# Patient Record
Sex: Male | Born: 1975 | Race: Black or African American | Hispanic: No | Marital: Single | Smoking: Current some day smoker
Health system: Southern US, Community
[De-identification: ages and names within clinical notes are randomized; demographics above are authoritative.]

---

## 2019-04-19 ENCOUNTER — Other Ambulatory Visit: Payer: Self-pay

## 2019-04-19 ENCOUNTER — Encounter (HOSPITAL_COMMUNITY): Payer: Self-pay | Admitting: Emergency Medicine

## 2019-04-19 ENCOUNTER — Emergency Department (HOSPITAL_COMMUNITY)
Admission: EM | Admit: 2019-04-19 | Discharge: 2019-04-19 | Disposition: A | Discharge status: Home / Self Care | Payer: Self-pay | Attending: Emergency Medicine | Admitting: Emergency Medicine

## 2019-04-19 DIAGNOSIS — F17200 Nicotine dependence, unspecified, uncomplicated: Secondary | ICD-10-CM | POA: Insufficient documentation

## 2019-04-19 DIAGNOSIS — M5432 Sciatica, left side: Secondary | ICD-10-CM | POA: Insufficient documentation

## 2019-04-19 MED ORDER — KETOROLAC TROMETHAMINE 30 MG/ML IJ SOLN
30.0000 mg | Freq: Once | INTRAMUSCULAR | Status: AC
  Administered 2019-04-19: 12:00:00 30 mg via INTRAMUSCULAR
  Filled 2019-04-19: qty 1

## 2019-04-19 MED ORDER — CYCLOBENZAPRINE HCL 10 MG PO TABS: 10.0000 mg | ORAL_TABLET | Freq: Two times a day (BID) | ORAL | 0 refills | Status: AC | PRN

## 2019-04-19 MED ORDER — OXYCODONE-ACETAMINOPHEN 5-325 MG PO TABS: 1.0000 | ORAL_TABLET | Freq: Four times a day (QID) | ORAL | 0 refills | Status: AC | PRN

## 2019-04-19 NOTE — Discharge Instructions (Addendum)
Please read attached information. If you experience any new or worsening signs or symptoms please return to the emergency room for evaluation. Please follow-up with your primary care provider or specialist as discussed. Please use medication prescribed only as directed and discontinue taking if you have any concerning signs or symptoms.   °

## 2019-04-19 NOTE — ED Notes (Signed)
Back pain since Thursday,  Left side lower back, now pain shoots down leg denies injury, has been sleeping on a couch, wife is in labor upstairs

## 2019-04-19 NOTE — ED Triage Notes (Signed)
Pt in with c/o L low back pain that radiates down L thigh. States pain has gotten worse x few days, denies any urinary symptoms or fevers

## 2019-04-19 NOTE — ED Provider Notes (Signed)
[facility] EMERGENCY DEPARTMENT Provider Note   CSN: [id number] Arrival date & time: 04/19/19  1047  History   Chief Complaint Chief Complaint  Patient presents with  . Back Pain    HPI Tyrone Carroll is a 43 y.o. male.     HPI    43 year old male presents today with complaints of lower back pain.  Patient notes last week he developed pain in his left lower back.  He notes this is progressively worsened with pain into his buttocks.  He notes pain with ambulation, pain with lifting his left leg.  He denies any distal weakness or sensory deficits.  He denies any abdominal pain or urinary dysfunction.  He denies any fever or IV drug use or trauma.  No history of the same.  He does note that last night he sleeping in the hospital his wife is preparing for delivery.  He has not used any medications prior to arrival.    History reviewed. No pertinent past medical history.  There are no active problems to display for this patient.   History reviewed. No pertinent surgical history.      Home Medications    Prior to Admission medications   Medication Sig Start Date End Date Taking? Authorizing Provider  cyclobenzaprine (FLEXERIL) 10 MG tablet Take 1 tablet (10 mg total) by mouth 2 (two) times daily as needed for muscle spasms. 04/19/19   Arriyanna Mersch, Tinnie Gens, PA-C  oxyCODONE-acetaminophen (PERCOCET/ROXICET) 5-325 MG tablet Take 1 tablet by mouth every 6 (six) hours as needed. 04/19/19   Eyvonne Mechanic, PA-C    Family History No family history on file.  Social History Social History   Tobacco Use  . Smoking status: Current Some Day Smoker  . Smokeless tobacco: Never Used  Substance Use Topics  . Alcohol use: Never    Frequency: Never  . Drug use: Never     Allergies   Patient has no allergy information on record.   Review of Systems Review of Systems  All other systems reviewed and are negative.  Physical Exam Updated Vital Signs BP 130/78 (BP  Location: Left Arm)   Pulse 75   Temp 98.3 F (36.8 C) (Oral)   Resp 18   Wt 88.5 kg   SpO2 100%   Physical Exam Vitals signs and nursing note reviewed.  Constitutional:      Appearance: He is well-developed.  HENT:     Head: Normocephalic and atraumatic.  Eyes:     General: No scleral icterus.       Right eye: No discharge.        Left eye: No discharge.     Conjunctiva/sclera: Conjunctivae normal.     Pupils: Pupils are equal, round, and reactive to light.  Neck:     Musculoskeletal: Normal range of motion.     Vascular: No JVD.     Trachea: No tracheal deviation.  Pulmonary:     Effort: Pulmonary effort is normal.     Breath sounds: No stridor.  Abdominal:     Comments: Soft nontender  Musculoskeletal:     Comments: Tenderness palpation left lower lumbar musculature, straight leg positive left, distal sensation strength and motor function intact-rashes or bruising noted to the back  Neurological:     Mental Status: He is alert and oriented to person, place, and time.     Coordination: Coordination normal.  Psychiatric:        Behavior: Behavior normal.  Thought Content: Thought content normal.        Judgment: Judgment normal.     ED Treatments / Results  Labs (all labs ordered are listed, but only abnormal results are displayed) Labs Reviewed - No data to display  EKG None  Radiology No results found.  Procedures Procedures (including critical care time)  Medications Ordered in ED Medications  ketorolac (TORADOL) 30 MG/ML injection 30 mg (30 mg Intramuscular Given 04/19/19 1217)     Initial Impression / Assessment and Plan / ED Course  I have reviewed the triage vital signs and the nursing notes.  Pertinent labs & imaging results that were available during my care of the patient were reviewed by me and considered in my medical decision making (see chart for details).          Therapeutics: Toradol   Assessment/Plan: 43 year old male  presents today with back pain.  Patient having sciatic type symptoms, no neurological deficits, no red flags.  Patient will be given pain medicine and discharged with outpatient follow-up and strict return precautions.  Verbalized understanding and agreement to today's plan.  Final Clinical Impressions(s) / ED Diagnoses   Final diagnoses:  Sciatica of left side    ED Discharge Orders         Ordered    oxyCODONE-acetaminophen (PERCOCET/ROXICET) 5-325 MG tablet  Every 6 hours PRN     04/19/19 1236    cyclobenzaprine (FLEXERIL) 10 MG tablet  2 times daily PRN     04/19/19 1236           Eyvonne MechanicHedges, Baylon Santelli, PA-C 04/19/19 1238    Terrilee FilesButler, Michael C, MD 04/19/19 1801

## 2019-04-20 ENCOUNTER — Emergency Department (HOSPITAL_COMMUNITY): Payer: Self-pay

## 2019-04-20 ENCOUNTER — Emergency Department (HOSPITAL_COMMUNITY)
Admission: EM | Admit: 2019-04-20 | Discharge: 2019-04-20 | Disposition: A | Discharge status: Home / Self Care | Payer: Self-pay | Attending: Emergency Medicine | Admitting: Emergency Medicine

## 2019-04-20 ENCOUNTER — Other Ambulatory Visit: Payer: Self-pay

## 2019-04-20 ENCOUNTER — Encounter (HOSPITAL_COMMUNITY): Payer: Self-pay | Admitting: Emergency Medicine

## 2019-04-20 DIAGNOSIS — M5442 Lumbago with sciatica, left side: Secondary | ICD-10-CM | POA: Insufficient documentation

## 2019-04-20 MED ORDER — METHOCARBAMOL 500 MG PO TABS: 500.0000 mg | ORAL_TABLET | Freq: Two times a day (BID) | ORAL | 0 refills | Status: AC

## 2019-04-20 MED ORDER — DICLOFENAC SODIUM 1 % TD GEL: 4.0000 g | Freq: Four times a day (QID) | TRANSDERMAL | 0 refills | Status: AC

## 2019-04-20 MED ORDER — IBUPROFEN 400 MG PO TABS
600.0000 mg | ORAL_TABLET | Freq: Once | ORAL | Status: AC
  Administered 2019-04-20: 600 mg via ORAL
  Filled 2019-04-20: qty 1

## 2019-04-20 MED ORDER — LIDOCAINE 5 % EX PTCH: 1.0000 | MEDICATED_PATCH | CUTANEOUS | 0 refills | Status: AC

## 2019-04-20 MED ORDER — IBUPROFEN 600 MG PO TABS: 600.0000 mg | ORAL_TABLET | Freq: Four times a day (QID) | ORAL | 0 refills | Status: AC | PRN

## 2019-04-20 MED ORDER — DEXAMETHASONE SODIUM PHOSPHATE 10 MG/ML IJ SOLN
10.0000 mg | Freq: Once | INTRAMUSCULAR | Status: AC
  Administered 2019-04-20: 10 mg via INTRAMUSCULAR
  Filled 2019-04-20: qty 1

## 2019-04-20 NOTE — ED Triage Notes (Signed)
Pt. Stated, I had back pain and radiating to left leg on Wednesday and was seen here yesterday for the same. Today is a lot worse with back and left arm pain.

## 2019-04-20 NOTE — Discharge Instructions (Signed)
Take it easy, but do not lay around too much as this may make any stiffness worse.  Antiinflammatory medications: Take 600 mg of ibuprofen every 6 hours or 440 mg (over the counter dose) to 500 mg (prescription dose) of naproxen every 12 hours for the next 3 days. After this time, these medications may be used as needed for pain. Take these medications with food to avoid upset stomach. Choose only one of these medications, do not take them together. Acetaminophen (generic for Tylenol): Should you continue to have additional pain while taking the ibuprofen or naproxen, you may add in acetaminophen as needed. Your daily total maximum amount of acetaminophen from all sources should be limited to 4000mg /day for persons without liver problems, or 2000mg /day for those with liver problems. Methocarbamol: Methocarbamol (generic for Robaxin) is a muscle relaxer and can help relieve stiff muscles or muscle spasms.  Do not drive or perform other dangerous activities while taking this medication as it can cause drowsiness as well as changes in reaction time and judgement. Methocarbamol Is a Medication That Can Be Used INSTEAD Of the cyclobenzaprine.  You may use either medication, however, do not use them together. Diclofenac gel: This is a topical anti-inflammatory medication and can be applied directly to the painful region.  Do not use on the face or genitals.  This medication may be used as an alternative to oral anti-inflammatory medications, such as ibuprofen or naproxen. Lidocaine patches: These are available via either prescription or over-the-counter. The over-the-counter option may be more economical one and are likely just as effective. There are multiple over-the-counter brands, such as Salonpas. Exercises: Be sure to perform the attached exercises starting with three times a week and working up to performing them daily. This is an essential part of preventing long term problems.  Follow up: Follow up with a  primary care provider for any future management of these complaints. Be sure to follow up within 7-10 days. Return: Return to the ED should symptoms worsen.  For prescription assistance, may try using prescription discount sites or apps, such as goodrx.com

## 2019-04-20 NOTE — ED Provider Notes (Signed)
[facility] EMERGENCY DEPARTMENT Provider Note   CSN: [id number] Arrival date & time: 04/20/19  6269    History   Chief Complaint Chief Complaint  Patient presents with  . Leg Pain    left  . Back Pain    HPI Tyrone Carroll is a 43 y.o. male.     HPI   Tyrone Carroll is a 43 y.o. male, patient with no pertinent past medical history presenting to the ED with back pain beginning one week ago. Gradually developed left lower back pain accompanied by pain and tingling extending down the back of the left leg. Pain is worse with ambulation, moderate to severe, sharp.  Before this past week, he has not had this issue before. He was seen in the ED yesterday, diagnosed with sciatica, prescribed flexeril and percocet, and advised to follow up with orthopedics. He states he took these medications, but is still having pain today. He notes his significant other just delivered their daughter a couple days ago and he has been on his feet more.  He has also been sleeping on a rather uncomfortable visitor bed here in the hospital. Denies IVDA, HIV, or immune compromise.  Denies fever/chills, falls/trauma, abdominal pain, urinary symptoms, changes in bowel or bladder function, saddle anesthesias, or any other complaints.      History reviewed. No pertinent past medical history.  There are no active problems to display for this patient.   History reviewed. No pertinent surgical history.      Home Medications    Prior to Admission medications   Medication Sig Start Date End Date Taking? Authorizing Provider  cyclobenzaprine (FLEXERIL) 10 MG tablet Take 1 tablet (10 mg total) by mouth 2 (two) times daily as needed for muscle spasms. 04/19/19   Hedges, Tinnie Gens, PA-C  diclofenac sodium (VOLTAREN) 1 % GEL Apply 4 g topically 4 (four) times daily. 04/20/19   Joy, Shawn C, PA-C  ibuprofen (ADVIL) 600 MG tablet Take 1 tablet (600 mg total) by mouth every 6 (six) hours as needed.  04/20/19   Joy, Shawn C, PA-C  lidocaine (LIDODERM) 5 % Place 1 patch onto the skin daily. Remove & Discard patch within 12 hours or as directed by MD 04/20/19   Joy, Shawn C, PA-C  methocarbamol (ROBAXIN) 500 MG tablet Take 1 tablet (500 mg total) by mouth 2 (two) times daily. 04/20/19   Joy, Shawn C, PA-C  oxyCODONE-acetaminophen (PERCOCET/ROXICET) 5-325 MG tablet Take 1 tablet by mouth every 6 (six) hours as needed. 04/19/19   Eyvonne Mechanic, PA-C    Family History No family history on file.  Social History Social History   Tobacco Use  . Smoking status: Current Some Day Smoker  . Smokeless tobacco: Never Used  Substance Use Topics  . Alcohol use: Never    Frequency: Never  . Drug use: Never     Allergies   Patient has no allergy information on record.   Review of Systems Review of Systems  Constitutional: Negative for chills, diaphoresis and fever.  Respiratory: Negative for shortness of breath.   Cardiovascular: Negative for chest pain.  Gastrointestinal: Negative for abdominal pain, blood in stool, diarrhea, nausea and vomiting.  Genitourinary: Negative for difficulty urinating, dysuria, frequency and hematuria.  Musculoskeletal: Positive for back pain.  Neurological: Negative for weakness and numbness.  All other systems reviewed and are negative.    Physical Exam Updated Vital Signs BP (!) 156/89 (BP Location: Right Arm)   Pulse 93   Temp  98.3 F (36.8 C) (Oral)   Resp 18   SpO2 98%   Physical Exam Vitals signs and nursing note reviewed.  Constitutional:      General: He is not in acute distress.    Appearance: He is well-developed. He is not diaphoretic.  HENT:     Head: Normocephalic and atraumatic.     Mouth/Throat:     Mouth: Mucous membranes are moist.     Pharynx: Oropharynx is clear.  Eyes:     Conjunctiva/sclera: Conjunctivae normal.  Neck:     Musculoskeletal: Neck supple.  Cardiovascular:     Rate and Rhythm: Normal rate and regular  rhythm.     Pulses: Normal pulses.          Radial pulses are 2+ on the right side and 2+ on the left side.       Posterior tibial pulses are 2+ on the right side and 2+ on the left side.     Comments: Tactile temperature in the extremities appropriate and equal bilaterally. Pulmonary:     Effort: Pulmonary effort is normal. No respiratory distress.  Abdominal:     Palpations: Abdomen is soft.     Tenderness: There is no abdominal tenderness. There is no guarding.  Musculoskeletal:     Lumbar back: He exhibits tenderness.       Back:     Right lower leg: No edema.     Left lower leg: No edema.     Comments: Tenderness in the left lower back and into the left buttocks as indicated. He has full range of motion in the left hip, knee, and ankle.  Lymphadenopathy:     Cervical: No cervical adenopathy.  Skin:    General: Skin is warm and dry.  Neurological:     Mental Status: He is alert.     Deep Tendon Reflexes:     Reflex Scores:      Patellar reflexes are 2+ on the right side and 2+ on the left side.    Comments: Sensation grossly intact to light touch in the lower extremities bilaterally. No saddle anesthesias. Strength 5/5 in the bilateral lower extremities. Slow, antalgic gait, but ambulatory  Coordination intact with heel to shin testing.  Psychiatric:        Mood and Affect: Mood and affect normal.        Speech: Speech normal.        Behavior: Behavior normal.      ED Treatments / Results  Labs (all labs ordered are listed, but only abnormal results are displayed) Labs Reviewed - No data to display  EKG None  Radiology Dg Lumbar Spine Complete  Result Date: 04/20/2019 CLINICAL DATA:  Lumbago with left-sided radicular symptoms EXAM: LUMBAR SPINE - COMPLETE 4+ VIEW COMPARISON:  None. FINDINGS: Frontal, lateral, spot lumbosacral lateral, and bilateral oblique views were obtained. There are 5 non-rib-bearing lumbar type vertebral bodies. There is no fracture or  spondylolisthesis. There is moderate disc space narrowing at L4-5. Other disc spaces appear unremarkable. There are anterior osteophytes at L4 and L5. There is no appreciable facet arthropathy. IMPRESSION: Moderate disc space narrowing at L4-5. Other disc spaces appear unremarkable. No fracture or spondylolisthesis. Electronically Signed   By: Bretta BangWilliam  Woodruff III M.D.   On: 04/20/2019 11:09    Procedures Procedures (including critical care time)  Medications Ordered in ED Medications  dexamethasone (DECADRON) injection 10 mg (10 mg Intramuscular Given 04/20/19 1029)  ibuprofen (ADVIL) tablet 600 mg (600 mg  Oral Given 04/20/19 1125)     Initial Impression / Assessment and Plan / ED Course  I have reviewed the triage vital signs and the nursing notes.  Pertinent labs & imaging results that were available during my care of the patient were reviewed by me and considered in my medical decision making (see chart for details).        Patient presents with lower back pain.  Symptoms and physical exam findings suggest sciatica.  No evidence of neurovascular compromise.  My suspicion for cauda equina and other neurosurgical emergencies is low based on description of the pain, lack of red flag symptoms, and exam is not suggestive.  Disc space narrowing at L4-5 noted on x-ray.  Orthopedic follow-up. We discussed at length a more realistic timeline for improvement and recovery. The patient was given instructions for home care as well as return precautions. Patient voices understanding of these instructions, accepts the plan, and is comfortable with discharge.   Patient stated he wanted to try some crutches since he will need to be up and around with his wife while she is still in the hospital.  Dr. Charm Barges was notified of this patient as well as physical exam findings.   Final Clinical Impressions(s) / ED Diagnoses   Final diagnoses:  Acute left-sided low back pain with left-sided sciatica     ED Discharge Orders         Ordered    diclofenac sodium (VOLTAREN) 1 % GEL  4 times daily     04/20/19 1117    methocarbamol (ROBAXIN) 500 MG tablet  2 times daily     04/20/19 1117    ibuprofen (ADVIL) 600 MG tablet  Every 6 hours PRN     04/20/19 1117    lidocaine (LIDODERM) 5 %  Every 24 hours     04/20/19 1117           Anselm Pancoast, PA-C 04/20/19 1218    Terrilee Files, MD 04/21/19 [phone]

## 2019-04-20 NOTE — Progress Notes (Signed)
Orthopedic Tech Progress Note Patient Details:  Tyrone Carroll 02/05/1976 [id number]  Ortho Devices Type of Ortho Device: Crutches Ortho Device/Splint Interventions: Adjustment, Application, Ordered   Post Interventions Patient Tolerated: Well Instructions Provided: Poper ambulation with device, Care of device, Adjustment of device   Donald Pore 04/20/2019, 11:57 AM

## 2020-05-08 IMAGING — CR LUMBAR SPINE - COMPLETE 4+ VIEW
5 series · 5 of 5 positions shown · non-contrast
Comparison: None.

CLINICAL DATA: Lumbago with left-sided radicular symptoms

EXAM:
LUMBAR SPINE - COMPLETE 4+ VIEW

[l-spine ap]
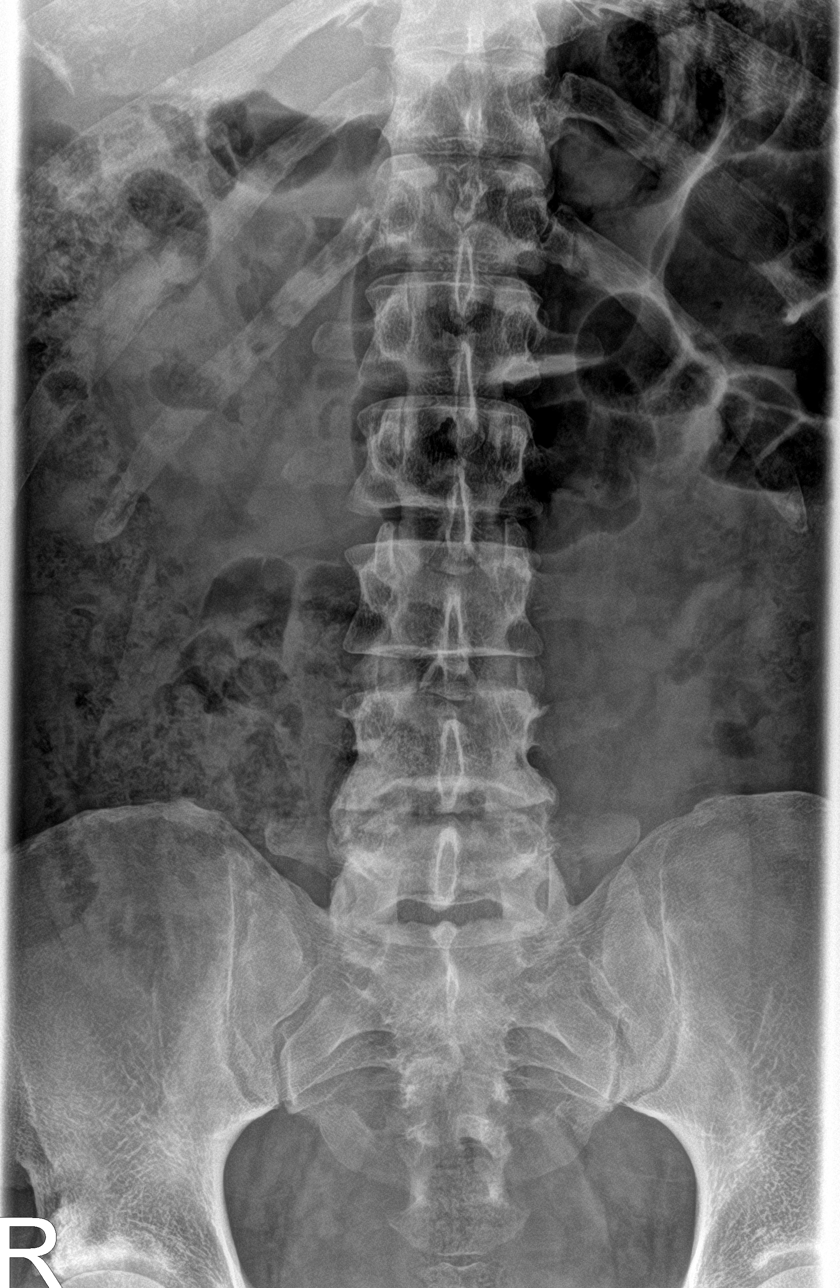

[l-spine obl (1 of 2)]
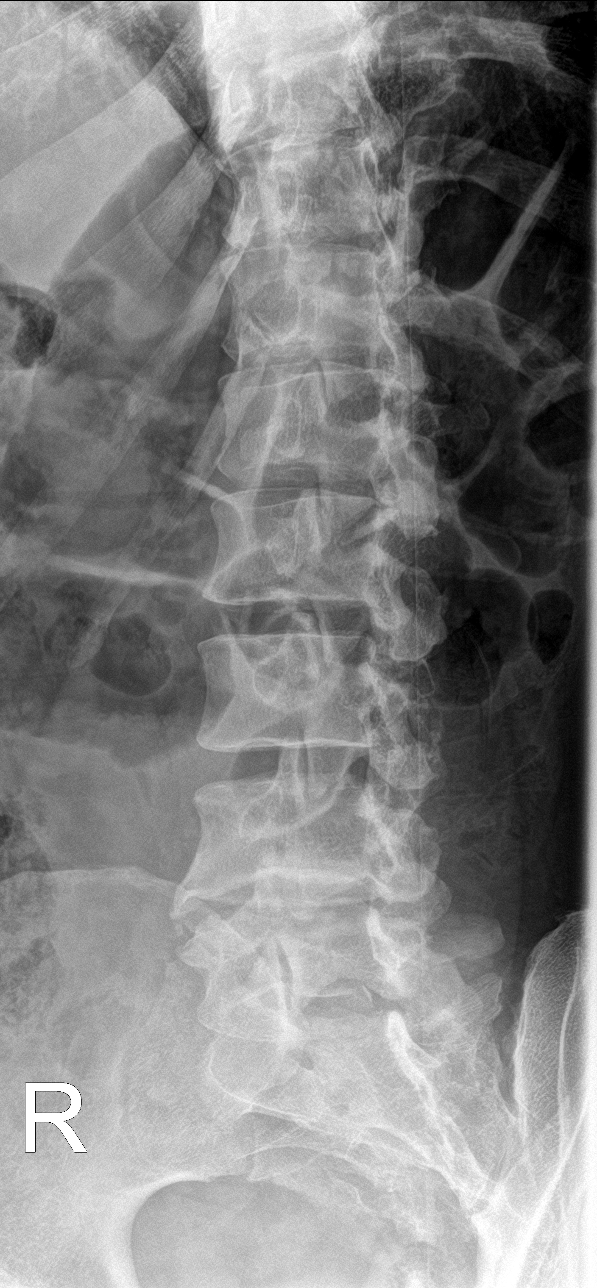

[l-spine obl (2 of 2)]
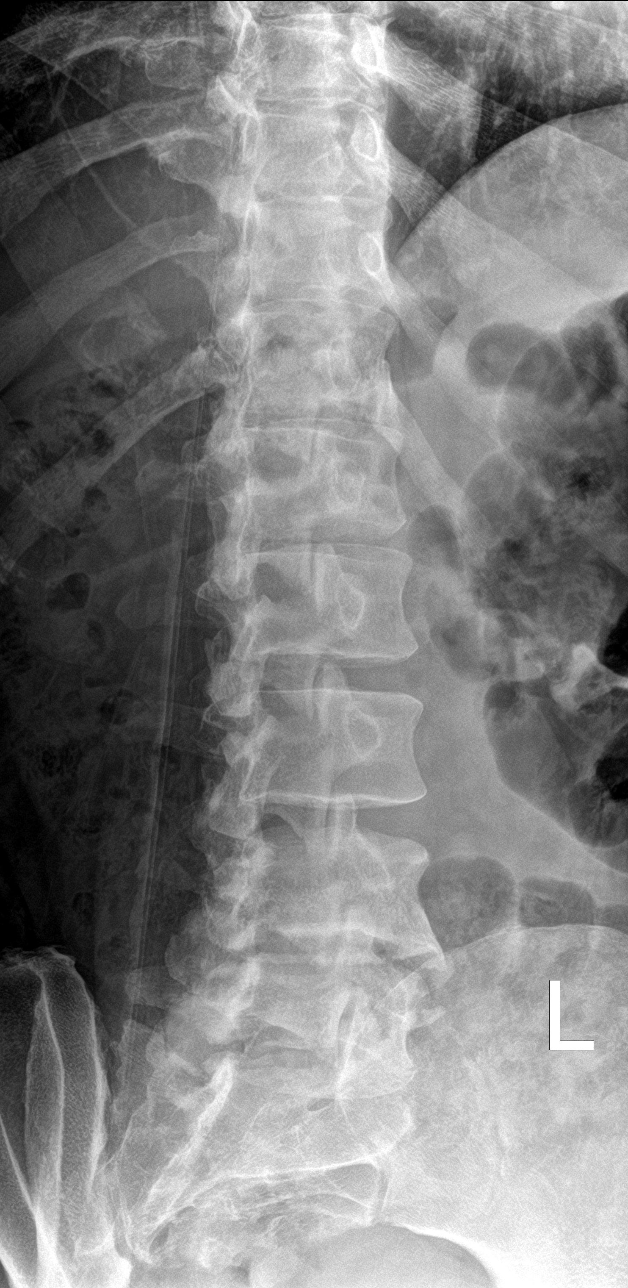

[l-spine lat]
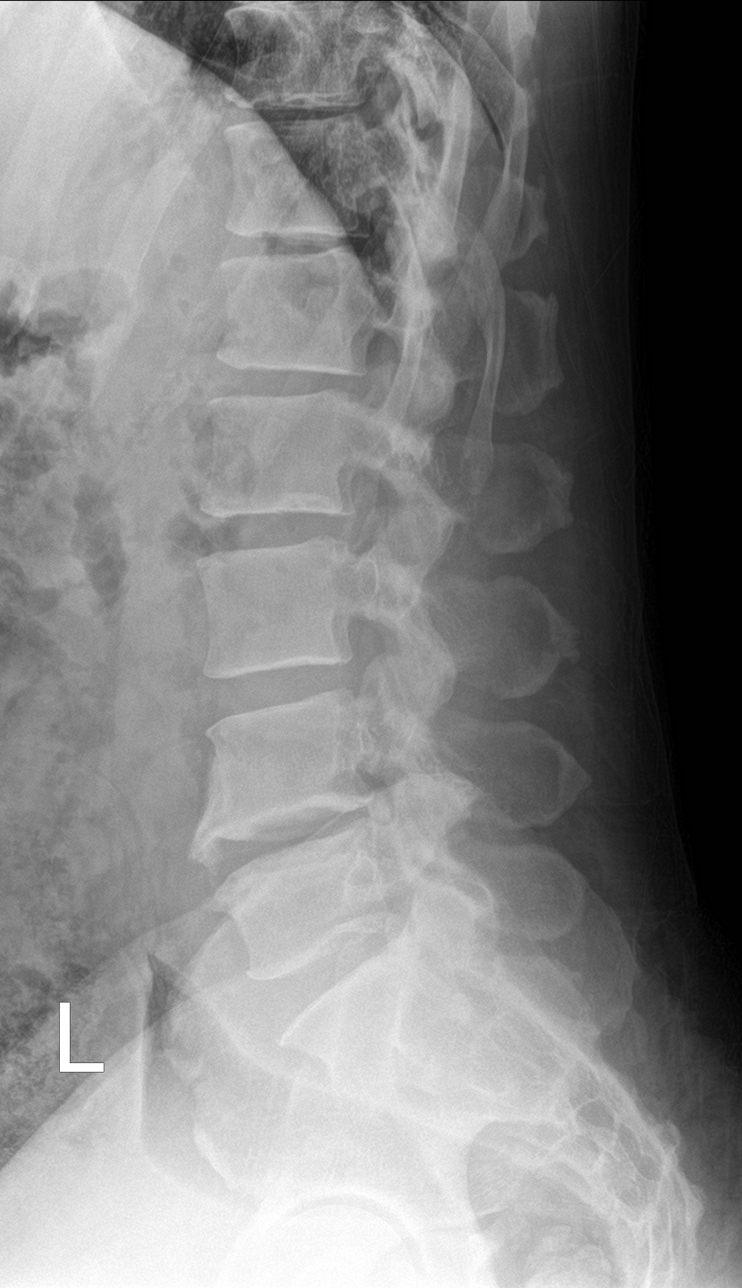

[l-spine spot]
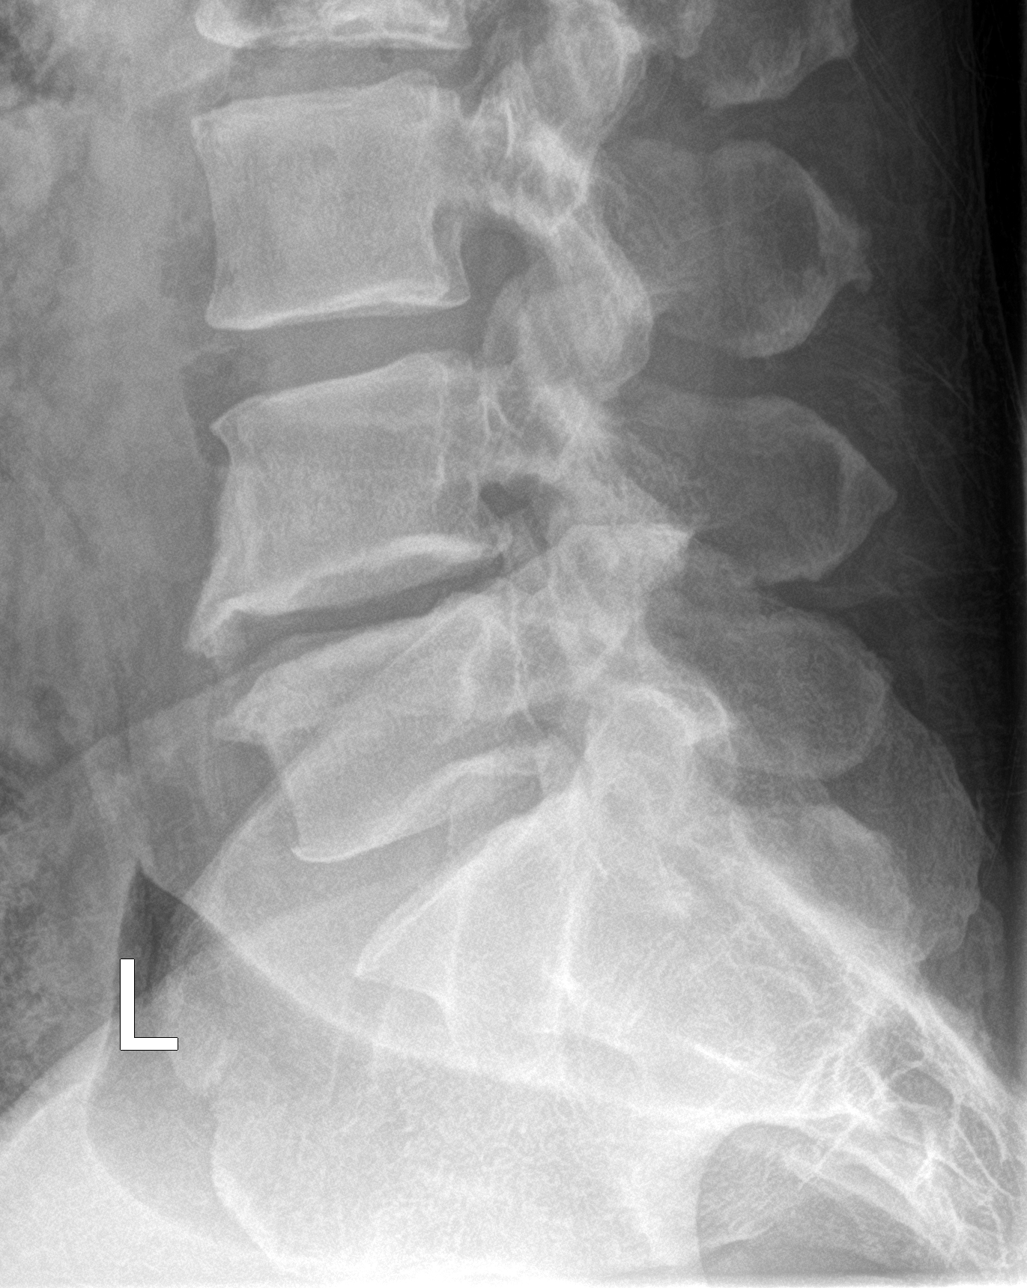

[5 of 5 positions shown; findings below may reference images not displayed]

FINDINGS: Frontal, lateral, spot lumbosacral lateral, and bilateral oblique
views were obtained. There are 5 non-rib-bearing lumbar type
vertebral bodies. There is no fracture or spondylolisthesis. There
is moderate disc space narrowing at L4-5. Other disc spaces appear
unremarkable. There are anterior osteophytes at L4 and L5. There is
no appreciable facet arthropathy.
IMPRESSION: Moderate disc space narrowing at L4-5. Other disc spaces appear
unremarkable. No fracture or spondylolisthesis.
